# Patient Record
Sex: Male | Born: 1990 | Race: White | Hispanic: No | Marital: Single | Smoking: Current every day smoker
Health system: Southern US, Community
[De-identification: ages and names within clinical notes are randomized; demographics above are authoritative.]

## PROBLEM LIST (undated history)

## (undated) HISTORY — PX: SKIN BIOPSY: SHX1

---

## 2014-11-07 ENCOUNTER — Emergency Department (HOSPITAL_COMMUNITY)
Admission: EM | Admit: 2014-11-07 | Discharge: 2014-11-08 | Disposition: A | Discharge status: Home / Self Care | Payer: No Typology Code available for payment source | Attending: Emergency Medicine | Admitting: Emergency Medicine

## 2014-11-07 ENCOUNTER — Encounter (HOSPITAL_COMMUNITY): Payer: Self-pay | Admitting: Emergency Medicine

## 2014-11-07 DIAGNOSIS — Z720 Tobacco use: Secondary | ICD-10-CM | POA: Diagnosis not present

## 2014-11-07 DIAGNOSIS — S199XXA Unspecified injury of neck, initial encounter: Secondary | ICD-10-CM | POA: Insufficient documentation

## 2014-11-07 DIAGNOSIS — S3992XA Unspecified injury of lower back, initial encounter: Secondary | ICD-10-CM | POA: Insufficient documentation

## 2014-11-07 DIAGNOSIS — Y92410 Unspecified street and highway as the place of occurrence of the external cause: Secondary | ICD-10-CM | POA: Insufficient documentation

## 2014-11-07 DIAGNOSIS — Y998 Other external cause status: Secondary | ICD-10-CM | POA: Insufficient documentation

## 2014-11-07 DIAGNOSIS — M545 Low back pain, unspecified: Secondary | ICD-10-CM

## 2014-11-07 DIAGNOSIS — Y9389 Activity, other specified: Secondary | ICD-10-CM | POA: Insufficient documentation
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
## 2014-11-08 ENCOUNTER — Emergency Department (HOSPITAL_COMMUNITY): Payer: No Typology Code available for payment source

## 2014-11-08 DIAGNOSIS — S3992XA Unspecified injury of lower back, initial encounter: Secondary | ICD-10-CM | POA: Diagnosis not present

## 2014-11-08 MED ORDER — OXYCODONE-ACETAMINOPHEN 5-325 MG PO TABS
1.0000 | ORAL_TABLET | Freq: Once | ORAL | Status: AC
  Administered 2014-11-08: 1 via ORAL
  Filled 2014-11-08: qty 1

## 2014-11-08 MED ORDER — OXYCODONE-ACETAMINOPHEN 5-325 MG PO TABS: 1.0000 | ORAL_TABLET | ORAL | Status: AC | PRN

## 2014-11-08 MED ORDER — IBUPROFEN 800 MG PO TABS
800.0000 mg | ORAL_TABLET | Freq: Three times a day (TID) | ORAL | Status: AC
Start: 2014-11-08 — End: ?

## 2014-11-08 MED ORDER — CYCLOBENZAPRINE HCL 10 MG PO TABS: 10.0000 mg | ORAL_TABLET | Freq: Two times a day (BID) | ORAL | Status: AC | PRN
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
## 2014-11-08 NOTE — ED Notes (Signed)
Awake. Verbally responsive. A/O x4. Resp even and unlabored. No audible adventitious breath sounds noted. ABC's intact.  
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
